# Patient Record
Sex: Female | Born: 1985 | Race: Black or African American | Hispanic: No | Marital: Married | Smoking: Never smoker
Health system: Southern US, Community
[De-identification: ages and names within clinical notes are randomized; demographics above are authoritative.]

## PROBLEM LIST (undated history)

## (undated) DIAGNOSIS — T7840XA Allergy, unspecified, initial encounter: Secondary | ICD-10-CM

## (undated) DIAGNOSIS — E079 Disorder of thyroid, unspecified: Secondary | ICD-10-CM

## (undated) HISTORY — DX: Disorder of thyroid, unspecified: E07.9

## (undated) HISTORY — DX: Allergy, unspecified, initial encounter: T78.40XA

---

## 2008-12-23 ENCOUNTER — Encounter (INDEPENDENT_AMBULATORY_CARE_PROVIDER_SITE_OTHER): Payer: Self-pay | Admitting: *Deleted

## 2009-01-11 ENCOUNTER — Ambulatory Visit: Payer: Self-pay | Admitting: Family Medicine

## 2009-01-11 LAB — CONVERTED CEMR LAB
Blood in Urine, dipstick: NEGATIVE
Ketones, urine, test strip: NEGATIVE
Nitrite: NEGATIVE
Specific Gravity, Urine: 1.015

## 2009-01-23 LAB — CONVERTED CEMR LAB
Alkaline Phosphatase: 67 units/L (ref 39–117)
BUN: 5 mg/dL — ABNORMAL LOW (ref 6–23)
Basophils Absolute: 0 10*3/uL (ref 0.0–0.1)
Bilirubin, Direct: 0.1 mg/dL (ref 0.0–0.3)
CO2: 29 meq/L (ref 19–32)
Calcium: 9.2 mg/dL (ref 8.4–10.5)
Creatinine, Ser: 0.6 mg/dL (ref 0.4–1.2)
Eosinophils Absolute: 0.1 10*3/uL (ref 0.0–0.7)
Glucose, Bld: 77 mg/dL (ref 70–99)
HDL: 55.5 mg/dL (ref >39.00)
Lymphocytes Relative: 39.9 % (ref 12.0–46.0)
MCHC: 33.7 g/dL (ref 30.0–36.0)
Neutrophils Relative %: 52.9 % (ref 43.0–77.0)
Platelets: 253 10*3/uL (ref 150.0–400.0)
RDW: 13.8 % (ref 11.5–14.6)
Total CHOL/HDL Ratio: 3
Triglycerides: 53 mg/dL (ref 0.0–149.0)

## 2009-01-25 ENCOUNTER — Ambulatory Visit: Payer: Self-pay | Admitting: Family Medicine

## 2009-01-25 DIAGNOSIS — H6120 Impacted cerumen, unspecified ear: Secondary | ICD-10-CM | POA: Insufficient documentation

## 2012-06-28 ENCOUNTER — Ambulatory Visit (INDEPENDENT_AMBULATORY_CARE_PROVIDER_SITE_OTHER): Payer: BC Managed Care – PPO | Admitting: Family Medicine

## 2012-06-28 ENCOUNTER — Encounter: Payer: Self-pay | Admitting: Family Medicine

## 2012-06-28 VITALS — BP 110/74 | HR 106 | Temp 98.4°F | Ht 66.5 in | Wt 200.6 lb

## 2012-06-28 DIAGNOSIS — Z202 Contact with and (suspected) exposure to infections with a predominantly sexual mode of transmission: Secondary | ICD-10-CM

## 2012-06-28 DIAGNOSIS — Z0000 Encounter for general adult medical examination without abnormal findings: Secondary | ICD-10-CM

## 2012-06-28 DIAGNOSIS — L299 Pruritus, unspecified: Secondary | ICD-10-CM

## 2012-06-28 DIAGNOSIS — R51 Headache: Secondary | ICD-10-CM

## 2012-06-28 DIAGNOSIS — N926 Irregular menstruation, unspecified: Secondary | ICD-10-CM

## 2012-06-28 LAB — BASIC METABOLIC PANEL
BUN: 11 mg/dL (ref 6–23)
Chloride: 104 mEq/L (ref 96–112)
Glucose, Bld: 83 mg/dL (ref 70–99)
Potassium: 3.5 mEq/L (ref 3.5–5.1)
Sodium: 137 mEq/L (ref 135–145)

## 2012-06-28 LAB — HEPATIC FUNCTION PANEL
ALT: 116 U/L — ABNORMAL HIGH (ref 0–35)
AST: 70 U/L — ABNORMAL HIGH (ref 0–37)
Albumin: 3.2 g/dL — ABNORMAL LOW (ref 3.5–5.2)
Alkaline Phosphatase: 77 U/L (ref 39–117)
Total Bilirubin: 0.3 mg/dL (ref 0.3–1.2)

## 2012-06-28 LAB — POCT URINE PREGNANCY: Preg Test, Ur: NEGATIVE

## 2012-06-28 LAB — LIPID PANEL
Cholesterol: 119 mg/dL (ref 0–200)
LDL Cholesterol: 71 mg/dL (ref 0–99)

## 2012-06-28 LAB — POCT URINALYSIS DIPSTICK
Ketones, UA: NEGATIVE
Protein, UA: NEGATIVE
Spec Grav, UA: >=1.03
Urobilinogen, UA: 0.2
pH, UA: 5

## 2012-06-28 LAB — MICROALBUMIN / CREATININE URINE RATIO
Creatinine,U: 107.6 mg/dL
Microalb Creat Ratio: 0.9 mg/g (ref 0.0–30.0)
Microalb, Ur: 1 mg/dL (ref 0.0–1.9)

## 2012-06-28 NOTE — Assessment & Plan Note (Signed)
aquaphor

## 2012-06-28 NOTE — Progress Notes (Signed)
Subjective:     Kao Camba is a 27 y.o. female and is here for a comprehensive physical exam. The patient reports problems-- irregular periods ,  Headaches  And itchy skin  History   Social History  . Marital Status: Married    Spouse Name: N/A    Number of Children: N/A  . Years of Education: N/A   Occupational History  . school--     Social History Main Topics  . Smoking status: Never Smoker   . Smokeless tobacco: Never Used  . Alcohol Use: No  . Drug Use: No  . Sexually Active: No   Other Topics Concern  . Not on file   Social History Narrative   Exercise-- no   No health maintenance topics applied.  The following portions of the patient's history were reviewed and updated as appropriate:  She  has no past medical history on file. She  does not have any pertinent problems on file. She  has no past surgical history on file. Her family history is not on file. She  reports that she has never smoked. She has never used smokeless tobacco. She reports that she does not drink alcohol or use illicit drugs. She has a current medication list which includes the following prescription(s): diphenhydramine and ferrous sulfate. No current outpatient prescriptions on file prior to visit.   No current facility-administered medications on file prior to visit.   She has No Known Allergies..  Review of Systems Review of Systems  Constitutional: Negative for activity change, appetite change and fatigue.  HENT: Negative for hearing loss, congestion, tinnitus and ear discharge.  dentist---due Eyes: Negative for visual disturbance (see optho q1y -- vision corrected to 20/20 with glasses).  Respiratory: Negative for cough, chest tightness and shortness of breath.   Cardiovascular: Negative for chest pain, palpitations and leg swelling.  Gastrointestinal: Negative for abdominal pain, diarrhea, constipation and abdominal distention.  Genitourinary: Negative for urgency, frequency,  decreased urine volume and difficulty urinating.  Musculoskeletal: Negative for back pain, arthralgias and gait problem.  Skin: Negative for color change, pallor and rash.  Neurological: Negative for dizziness, light-headedness, numbness and headaches.  Hematological: Negative for adenopathy. Does not bruise/bleed easily.  Psychiatric/Behavioral: Negative for suicidal ideas, confusion, sleep disturbance, self-injury, dysphoric mood, decreased concentration and agitation.       Objective:    BP 110/74  Pulse 106  Temp(Src) 98.4 F (36.9 C) (Oral)  Ht 5' 6.5" (1.689 m)  Wt 200 lb 9.6 oz (90.992 kg)  BMI 31.9 kg/m2  SpO2 97% General appearance: alert, cooperative, appears stated age and no distress Head: Normocephalic, without obvious abnormality, atraumatic Eyes: conjunctivae/corneas clear. PERRL, EOM's intact. Fundi benign. Ears: normal TM's and external ear canals both ears Nose: Nares normal. Septum midline. Mucosa normal. No drainage or sinus tenderness. Throat: lips, mucosa, and tongue normal; teeth and gums normal Neck: no adenopathy, supple, symmetrical, trachea midline and thyroid not enlarged, symmetric, no tenderness/mass/nodules Back: symmetric, no curvature. ROM normal. No CVA tenderness. Lungs: clear to auscultation bilaterally Breasts: deferred until later app Heart: regular rate and rhythm, S1, S2 normal, no murmur, click, rub or gallop Abdomen: soft, non-tender; bowel sounds normal; no masses,  no organomegaly Pelvic: deferred--to be scheduled Extremities: extremities normal, atraumatic, no cyanosis or edema Pulses: 2+ and symmetric Skin: Skin color, texture, turgor normal. No rashes or lesions Lymph nodes: Cervical, supraclavicular, and axillary nodes normal. Neurologic: Alert and oriented X 3, normal strength and tone. Normal symmetric reflexes. Normal coordination and  gait Psych-- no depression, no anxiety      Assessment:    Healthy female exam.       Plan:    ghm utd Check labs See After Visit Summary for Counseling Recommendations

## 2012-06-28 NOTE — Assessment & Plan Note (Signed)
US pelvis Pap Consider bcp

## 2012-06-28 NOTE — Patient Instructions (Addendum)
Preventive Care for Adults, Female A healthy lifestyle and preventive care can promote health and wellness. Preventive health guidelines for women include the following key practices.  A routine yearly physical is a good way to check with your caregiver about your health and preventive screening. It is a chance to share any concerns and updates on your health, and to receive a thorough exam.  Visit your dentist for a routine exam and preventive care every 6 months. Brush your teeth twice a day and floss once a day. Good oral hygiene prevents tooth decay and gum disease.  The frequency of eye exams is based on your age, health, family medical history, use of contact lenses, and other factors. Follow your caregiver's recommendations for frequency of eye exams.  Eat a healthy diet. Foods like vegetables, fruits, whole grains, low-fat dairy products, and lean protein foods contain the nutrients you need without too many calories. Decrease your intake of foods high in solid fats, added sugars, and salt. Eat the right amount of calories for you.Get information about a proper diet from your caregiver, if necessary.  Regular physical exercise is one of the most important things you can do for your health. Most adults should get at least 150 minutes of moderate-intensity exercise (any activity that increases your heart rate and causes you to sweat) each week. In addition, most adults need muscle-strengthening exercises on 2 or more days a week.  Maintain a healthy weight. The body mass index (BMI) is a screening tool to identify possible weight problems. It provides an estimate of body fat based on height and weight. Your caregiver can help determine your BMI, and can help you achieve or maintain a healthy weight.For adults 20 years and older:  A BMI below 18.5 is considered underweight.  A BMI of 18.5 to 24.9 is normal.  A BMI of 25 to 29.9 is considered overweight.  A BMI of 30 and above is  considered obese.  Maintain normal blood lipids and cholesterol levels by exercising and minimizing your intake of saturated fat. Eat a balanced diet with plenty of fruit and vegetables. Blood tests for lipids and cholesterol should begin at age 20 and be repeated every 5 years. If your lipid or cholesterol levels are high, you are over 50, or you are at high risk for heart disease, you may need your cholesterol levels checked more frequently.Ongoing high lipid and cholesterol levels should be treated with medicines if diet and exercise are not effective.  If you smoke, find out from your caregiver how to quit. If you do not use tobacco, do not start.  If you are pregnant, do not drink alcohol. If you are breastfeeding, be very cautious about drinking alcohol. If you are not pregnant and choose to drink alcohol, do not exceed 1 drink per day. One drink is considered to be 12 ounces (355 mL) of beer, 5 ounces (148 mL) of wine, or 1.5 ounces (44 mL) of liquor.  Avoid use of street drugs. Do not share needles with anyone. Ask for help if you need support or instructions about stopping the use of drugs.  High blood pressure causes heart disease and increases the risk of stroke. Your blood pressure should be checked at least every 1 to 2 years. Ongoing high blood pressure should be treated with medicines if weight loss and exercise are not effective.  If you are 55 to 25 years old, ask your caregiver if you should take aspirin to prevent strokes.  Diabetes   screening involves taking a blood sample to check your fasting blood sugar level. This should be done once every 3 years, after age 45, if you are within normal weight and without risk factors for diabetes. Testing should be considered at a younger age or be carried out more frequently if you are overweight and have at least 1 risk factor for diabetes.  Breast cancer screening is essential preventive care for women. You should practice "breast  self-awareness." This means understanding the normal appearance and feel of your breasts and may include breast self-examination. Any changes detected, no matter how small, should be reported to a caregiver. Women in their 20s and 30s should have a clinical breast exam (CBE) by a caregiver as part of a regular health exam every 1 to 3 years. After age 40, women should have a CBE every year. Starting at age 40, women should consider having a mammography (breast X-ray test) every year. Women who have a family history of breast cancer should talk to their caregiver about genetic screening. Women at a high risk of breast cancer should talk to their caregivers about having magnetic resonance imaging (MRI) and a mammography every year.  The Pap test is a screening test for cervical cancer. A Pap test can show cell changes on the cervix that might become cervical cancer if left untreated. A Pap test is a procedure in which cells are obtained and examined from the lower end of the uterus (cervix).  Women should have a Pap test starting at age 21.  Between ages 21 and 29, Pap tests should be repeated every 2 years.  Beginning at age 30, you should have a Pap test every 3 years as long as the past 3 Pap tests have been normal.  Some women have medical problems that increase the chance of getting cervical cancer. Talk to your caregiver about these problems. It is especially important to talk to your caregiver if a new problem develops soon after your last Pap test. In these cases, your caregiver may recommend more frequent screening and Pap tests.  The above recommendations are the same for women who have or have not gotten the vaccine for human papillomavirus (HPV).  If you had a hysterectomy for a problem that was not cancer or a condition that could lead to cancer, then you no longer need Pap tests. Even if you no longer need a Pap test, a regular exam is a good idea to make sure no other problems are  starting.  If you are between ages 65 and 70, and you have had normal Pap tests going back 10 years, you no longer need Pap tests. Even if you no longer need a Pap test, a regular exam is a good idea to make sure no other problems are starting.  If you have had past treatment for cervical cancer or a condition that could lead to cancer, you need Pap tests and screening for cancer for at least 20 years after your treatment.  If Pap tests have been discontinued, risk factors (such as a new sexual partner) need to be reassessed to determine if screening should be resumed.  The HPV test is an additional test that may be used for cervical cancer screening. The HPV test looks for the virus that can cause the cell changes on the cervix. The cells collected during the Pap test can be tested for HPV. The HPV test could be used to screen women aged 30 years and older, and should   be used in women of any age who have unclear Pap test results. After the age of 30, women should have HPV testing at the same frequency as a Pap test.  Colorectal cancer can be detected and often prevented. Most routine colorectal cancer screening begins at the age of 50 and continues through age 75. However, your caregiver may recommend screening at an earlier age if you have risk factors for colon cancer. On a yearly basis, your caregiver may provide home test kits to check for hidden blood in the stool. Use of a small camera at the end of a tube, to directly examine the colon (sigmoidoscopy or colonoscopy), can detect the earliest forms of colorectal cancer. Talk to your caregiver about this at age 50, when routine screening begins. Direct examination of the colon should be repeated every 5 to 10 years through age 75, unless early forms of pre-cancerous polyps or small growths are found.  Hepatitis C blood testing is recommended for all people born from 1945 through 1965 and any individual with known risks for hepatitis C.  Practice  safe sex. Use condoms and avoid high-risk sexual practices to reduce the spread of sexually transmitted infections (STIs). STIs include gonorrhea, chlamydia, syphilis, trichomonas, herpes, HPV, and human immunodeficiency virus (HIV). Herpes, HIV, and HPV are viral illnesses that have no cure. They can result in disability, cancer, and death. Sexually active women aged 25 and younger should be checked for chlamydia. Older women with new or multiple partners should also be tested for chlamydia. Testing for other STIs is recommended if you are sexually active and at increased risk.  Osteoporosis is a disease in which the bones lose minerals and strength with aging. This can result in serious bone fractures. The risk of osteoporosis can be identified using a bone density scan. Women ages 65 and over and women at risk for fractures or osteoporosis should discuss screening with their caregivers. Ask your caregiver whether you should take a calcium supplement or vitamin D to reduce the rate of osteoporosis.  Menopause can be associated with physical symptoms and risks. Hormone replacement therapy is available to decrease symptoms and risks. You should talk to your caregiver about whether hormone replacement therapy is right for you.  Use sunscreen with sun protection factor (SPF) of 30 or more. Apply sunscreen liberally and repeatedly throughout the day. You should seek shade when your shadow is shorter than you. Protect yourself by wearing long sleeves, pants, a wide-brimmed hat, and sunglasses year round, whenever you are outdoors.  Once a month, do a whole body skin exam, using a mirror to look at the skin on your back. Notify your caregiver of new moles, moles that have irregular borders, moles that are larger than a pencil eraser, or moles that have changed in shape or color.  Stay current with required immunizations.  Influenza. You need a dose every fall (or winter). The composition of the flu vaccine  changes each year, so being vaccinated once is not enough.  Pneumococcal polysaccharide. You need 1 to 2 doses if you smoke cigarettes or if you have certain chronic medical conditions. You need 1 dose at age 65 (or older) if you have never been vaccinated.  Tetanus, diphtheria, pertussis (Tdap, Td). Get 1 dose of Tdap vaccine if you are younger than age 65, are over 65 and have contact with an infant, are a healthcare worker, are pregnant, or simply want to be protected from whooping cough. After that, you need a Td   booster dose every 10 years. Consult your caregiver if you have not had at least 3 tetanus and diphtheria-containing shots sometime in your life or have a deep or dirty wound.  HPV. You need this vaccine if you are a woman age 26 or younger. The vaccine is given in 3 doses over 6 months.  Measles, mumps, rubella (MMR). You need at least 1 dose of MMR if you were born in 1957 or later. You may also need a second dose.  Meningococcal. If you are age 19 to 21 and a first-year college student living in a residence hall, or have one of several medical conditions, you need to get vaccinated against meningococcal disease. You may also need additional booster doses.  Zoster (shingles). If you are age 60 or older, you should get this vaccine.  Varicella (chickenpox). If you have never had chickenpox or you were vaccinated but received only 1 dose, talk to your caregiver to find out if you need this vaccine.  Hepatitis A. You need this vaccine if you have a specific risk factor for hepatitis A virus infection or you simply wish to be protected from this disease. The vaccine is usually given as 2 doses, 6 to 18 months apart.  Hepatitis B. You need this vaccine if you have a specific risk factor for hepatitis B virus infection or you simply wish to be protected from this disease. The vaccine is given in 3 doses, usually over 6 months. Preventive Services / Frequency Ages 19 to 39  Blood  pressure check.** / Every 1 to 2 years.  Lipid and cholesterol check.** / Every 5 years beginning at age 20.  Clinical breast exam.** / Every 3 years for women in their 20s and 30s.  Pap test.** / Every 2 years from ages 21 through 29. Every 3 years starting at age 30 through age 65 or 70 with a history of 3 consecutive normal Pap tests.  HPV screening.** / Every 3 years from ages 30 through ages 65 to 70 with a history of 3 consecutive normal Pap tests.  Hepatitis C blood test.** / For any individual with known risks for hepatitis C.  Skin self-exam. / Monthly.  Influenza immunization.** / Every year.  Pneumococcal polysaccharide immunization.** / 1 to 2 doses if you smoke cigarettes or if you have certain chronic medical conditions.  Tetanus, diphtheria, pertussis (Tdap, Td) immunization. / A one-time dose of Tdap vaccine. After that, you need a Td booster dose every 10 years.  HPV immunization. / 3 doses over 6 months, if you are 26 and younger.  Measles, mumps, rubella (MMR) immunization. / You need at least 1 dose of MMR if you were born in 1957 or later. You may also need a second dose.  Meningococcal immunization. / 1 dose if you are age 19 to 21 and a first-year college student living in a residence hall, or have one of several medical conditions, you need to get vaccinated against meningococcal disease. You may also need additional booster doses.  Varicella immunization.** / Consult your caregiver.  Hepatitis A immunization.** / Consult your caregiver. 2 doses, 6 to 18 months apart.  Hepatitis B immunization.** / Consult your caregiver. 3 doses usually over 6 months. Ages 40 to 64  Blood pressure check.** / Every 1 to 2 years.  Lipid and cholesterol check.** / Every 5 years beginning at age 20.  Clinical breast exam.** / Every year after age 40.  Mammogram.** / Every year beginning at age 40   and continuing for as long as you are in good health. Consult with your  caregiver.  Pap test.** / Every 3 years starting at age 30 through age 65 or 70 with a history of 3 consecutive normal Pap tests.  HPV screening.** / Every 3 years from ages 30 through ages 65 to 70 with a history of 3 consecutive normal Pap tests.  Fecal occult blood test (FOBT) of stool. / Every year beginning at age 50 and continuing until age 75. You may not need to do this test if you get a colonoscopy every 10 years.  Flexible sigmoidoscopy or colonoscopy.** / Every 5 years for a flexible sigmoidoscopy or every 10 years for a colonoscopy beginning at age 50 and continuing until age 75.  Hepatitis C blood test.** / For all people born from 1945 through 1965 and any individual with known risks for hepatitis C.  Skin self-exam. / Monthly.  Influenza immunization.** / Every year.  Pneumococcal polysaccharide immunization.** / 1 to 2 doses if you smoke cigarettes or if you have certain chronic medical conditions.  Tetanus, diphtheria, pertussis (Tdap, Td) immunization.** / A one-time dose of Tdap vaccine. After that, you need a Td booster dose every 10 years.  Measles, mumps, rubella (MMR) immunization. / You need at least 1 dose of MMR if you were born in 1957 or later. You may also need a second dose.  Varicella immunization.** / Consult your caregiver.  Meningococcal immunization.** / Consult your caregiver.  Hepatitis A immunization.** / Consult your caregiver. 2 doses, 6 to 18 months apart.  Hepatitis B immunization.** / Consult your caregiver. 3 doses, usually over 6 months. Ages 65 and over  Blood pressure check.** / Every 1 to 2 years.  Lipid and cholesterol check.** / Every 5 years beginning at age 20.  Clinical breast exam.** / Every year after age 40.  Mammogram.** / Every year beginning at age 40 and continuing for as long as you are in good health. Consult with your caregiver.  Pap test.** / Every 3 years starting at age 30 through age 65 or 70 with a 3  consecutive normal Pap tests. Testing can be stopped between 65 and 70 with 3 consecutive normal Pap tests and no abnormal Pap or HPV tests in the past 10 years.  HPV screening.** / Every 3 years from ages 30 through ages 65 or 70 with a history of 3 consecutive normal Pap tests. Testing can be stopped between 65 and 70 with 3 consecutive normal Pap tests and no abnormal Pap or HPV tests in the past 10 years.  Fecal occult blood test (FOBT) of stool. / Every year beginning at age 50 and continuing until age 75. You may not need to do this test if you get a colonoscopy every 10 years.  Flexible sigmoidoscopy or colonoscopy.** / Every 5 years for a flexible sigmoidoscopy or every 10 years for a colonoscopy beginning at age 50 and continuing until age 75.  Hepatitis C blood test.** / For all people born from 1945 through 1965 and any individual with known risks for hepatitis C.  Osteoporosis screening.** / A one-time screening for women ages 65 and over and women at risk for fractures or osteoporosis.  Skin self-exam. / Monthly.  Influenza immunization.** / Every year.  Pneumococcal polysaccharide immunization.** / 1 dose at age 65 (or older) if you have never been vaccinated.  Tetanus, diphtheria, pertussis (Tdap, Td) immunization. / A one-time dose of Tdap vaccine if you are over   65 and have contact with an infant, are a healthcare worker, or simply want to be protected from whooping cough. After that, you need a Td booster dose every 10 years.  Varicella immunization.** / Consult your caregiver.  Meningococcal immunization.** / Consult your caregiver.  Hepatitis A immunization.** / Consult your caregiver. 2 doses, 6 to 18 months apart.  Hepatitis B immunization.** / Check with your caregiver. 3 doses, usually over 6 months. ** Family history and personal history of risk and conditions may change your caregiver's recommendations. Document Released: 04/18/2001 Document Revised: 05/15/2011  Document Reviewed: 07/18/2010 [employer] Patient Information 2013 [employer].  

## 2012-07-02 ENCOUNTER — Other Ambulatory Visit (HOSPITAL_BASED_OUTPATIENT_CLINIC_OR_DEPARTMENT_OTHER): Payer: BC Managed Care – PPO

## 2012-07-04 ENCOUNTER — Other Ambulatory Visit: Payer: Self-pay | Admitting: Family Medicine

## 2012-07-04 DIAGNOSIS — E0590 Thyrotoxicosis, unspecified without thyrotoxic crisis or storm: Secondary | ICD-10-CM

## 2012-07-09 ENCOUNTER — Ambulatory Visit (HOSPITAL_BASED_OUTPATIENT_CLINIC_OR_DEPARTMENT_OTHER)
Admission: RE | Admit: 2012-07-09 | Discharge: 2012-07-09 | Disposition: A | Discharge status: Home / Self Care | Payer: BC Managed Care – PPO | Source: Ambulatory Visit | Attending: Family Medicine | Admitting: Family Medicine

## 2012-07-09 DIAGNOSIS — N926 Irregular menstruation, unspecified: Secondary | ICD-10-CM

## 2012-07-09 DIAGNOSIS — N915 Oligomenorrhea, unspecified: Secondary | ICD-10-CM | POA: Insufficient documentation

## 2012-07-15 ENCOUNTER — Other Ambulatory Visit: Payer: BC Managed Care – PPO

## 2012-07-16 ENCOUNTER — Encounter: Payer: Self-pay | Admitting: Family Medicine

## 2012-07-16 ENCOUNTER — Ambulatory Visit (INDEPENDENT_AMBULATORY_CARE_PROVIDER_SITE_OTHER): Payer: BC Managed Care – PPO | Admitting: Family Medicine

## 2012-07-16 ENCOUNTER — Other Ambulatory Visit: Payer: BC Managed Care – PPO

## 2012-07-16 VITALS — BP 118/74 | HR 96 | Temp 98.4°F | Wt 202.4 lb

## 2012-07-16 DIAGNOSIS — R7989 Other specified abnormal findings of blood chemistry: Secondary | ICD-10-CM

## 2012-07-16 DIAGNOSIS — E0590 Thyrotoxicosis, unspecified without thyrotoxic crisis or storm: Secondary | ICD-10-CM

## 2012-07-16 DIAGNOSIS — N926 Irregular menstruation, unspecified: Secondary | ICD-10-CM

## 2012-07-16 LAB — HEPATIC FUNCTION PANEL
ALT: 62 U/L — ABNORMAL HIGH (ref 0–35)
AST: 61 U/L — ABNORMAL HIGH (ref 0–37)
Total Bilirubin: 0.5 mg/dL (ref 0.3–1.2)

## 2012-07-16 NOTE — Addendum Note (Signed)
Addended by: Silvio Pate D on: 07/16/2012 11:13 AM   Modules accepted: Orders

## 2012-07-16 NOTE — Assessment & Plan Note (Signed)
Reviewed [place] with pt Referral to gyn Pt refused bcp at this time

## 2012-07-16 NOTE — Addendum Note (Signed)
Addended by: Silvio Pate D on: 07/16/2012 09:51 AM   Modules accepted: Orders

## 2012-07-16 NOTE — Progress Notes (Signed)
  Subjective:    Patient ID: Natasha Scott, female    DOB: 08-28-1985, 27 y.o.   MRN: [id number]  HPI Pt here to discuss her [place] results.  Pt is willing to see gyn but does not really want to go on BCP. Pt period did start again but has since stopped.  No new complaints.   Review of Systems As above    Objective:   Physical Exam BP 118/74  Pulse 96  Temp(Src) 98.4 F (36.9 C) (Oral)  Wt 202 lb 6.4 oz (91.808 kg)  BMI 32.18 kg/m2  SpO2 97%  LMP 07/13/2012 General appearance: alert, cooperative, appears stated age and no distress Abdomen: soft, non-tender; bowel sounds normal; no masses,  no organomegaly Neurologic: Grossly normal        Assessment & Plan:

## 2012-07-17 LAB — T4, FREE: Free T4: 5.13 ng/dL — ABNORMAL HIGH (ref 0.60–1.60)

## 2012-07-22 ENCOUNTER — Other Ambulatory Visit: Payer: Self-pay | Admitting: *Deleted

## 2012-07-22 DIAGNOSIS — E0590 Thyrotoxicosis, unspecified without thyrotoxic crisis or storm: Secondary | ICD-10-CM

## 2012-08-05 ENCOUNTER — Ambulatory Visit (INDEPENDENT_AMBULATORY_CARE_PROVIDER_SITE_OTHER): Payer: BC Managed Care – PPO | Admitting: Internal Medicine

## 2012-08-05 ENCOUNTER — Encounter: Payer: Self-pay | Admitting: Internal Medicine

## 2012-08-05 VITALS — BP 122/70 | HR 114 | Temp 97.7°F | Resp 12 | Ht 67.5 in | Wt 187.0 lb

## 2012-08-05 DIAGNOSIS — E0590 Thyrotoxicosis, unspecified without thyrotoxic crisis or storm: Secondary | ICD-10-CM

## 2012-08-05 DIAGNOSIS — E0500 Thyrotoxicosis with diffuse goiter without thyrotoxic crisis or storm: Secondary | ICD-10-CM | POA: Insufficient documentation

## 2012-08-05 MED ORDER — ATENOLOL 50 MG PO TABS: 50.0000 mg | ORAL_TABLET | Freq: Every day | ORAL | Status: DC

## 2012-08-05 NOTE — Progress Notes (Signed)
Patient ID: Natasha Scott, female   DOB: 06-Oct-1985, 27 y.o.   MRN: [id number]   HPI  Natasha Scott is a 27 y.o.-year-old female, referred by her PCP, Dr.Lowne, for evaluation for thyrotoxicosis.   She does not have a h/o thyroid disorders, but has been found to have a low TSH 2 mo ago.   Lab Results  Component Value Date   TSH 0.11* 07/16/2012   TSH 0.10* 06/28/2012   TSH 0.43 01/11/2009   FREET4 5.13* 07/16/2012  FREE T3 17.3*   Pt denies: heat intolerance, tremors, anxiety, palpitations, insomnia, hyperdefecation, but she had recent weight loss of 33 lbs in last few months. She also has dysphagia - needs to chew her food well to swallow it - in last 2 months,  odynophagia, lumps in neck or SOB with lying down.   She denies recent contrast studies, using iodine disinfectants, increasing iodine in diet (kelp, seaweed) or taking OTC herbal or weight loss supplements. she is not on Amiodarone or other meds that can decrease TSH. No use of steroids.  Pt does not have a FH of thyroid ds or autoimmune ds.   I reviewed her chart and she also has a history of irregular menstrual cycles - she has had this problem ever since Menarche but now worse. She was started on Provera, but does not take it consistently.  ROS: Constitutional: + weight loss, no fatigue, no subjective hyperthermia/hypothermia Eyes: no blurry vision, no xerophthalmia ENT: no sore throat, no nodules palpated in throat, + dysphagia/no odynophagia, no hoarseness Cardiovascular: no CP/SOB/palpitations/leg swelling Respiratory: no cough/SOB Gastrointestinal: +N/no V/D/C Musculoskeletal: no muscle/joint aches Skin: no rashes; + itching Neurological: no tremors/numbness/tingling/dizziness Psychiatric: no depression/anxiety Irreg menstrual cycles  Past Medical History  Diagnosis Date  . Thyroid disease     hyperthyroidism   . Allergy     seasonal allergies, itching   History reviewed. No pertinent past surgical  history.  History   Social History  . Marital Status: single    Number of Children: 0   Occupational History  . school-- MBA    Social History Main Topics  . Smoking status: Never Smoker   . Smokeless tobacco: Never Used  . Alcohol Use: No  . Drug Use: No  . Sexually Active: No   Social History Narrative   Exercise-- no   Current Outpatient Prescriptions on File Prior to Visit  Medication Sig Dispense Refill  . cetirizine (ZYRTEC) 10 MG tablet Take 10 mg by mouth daily.      . ferrous sulfate 325 (65 FE) MG tablet Take 325 mg by mouth daily with breakfast.       No current facility-administered medications on file prior to visit.   No Known Allergies  No family history on file.  PE: BP 122/70  Pulse 114  Temp(Src) 97.7 F (36.5 C) (Oral)  Resp 12  Ht 5' 7.5" (1.715 m)  Wt 187 lb (84.823 kg)  BMI 28.84 kg/m2  SpO2 97%  LMP 04/15/2012 Wt Readings from Last 3 Encounters:  08/05/12 187 lb (84.823 kg)  07/16/12 202 lb 6.4 oz (91.808 kg)  06/28/12 200 lb 9.6 oz (90.992 kg)   Constitutional: overweight, in NAD Eyes: PERRLA, EOMI, no exophthalmos, no lid lag, no stare ENT: moist mucous membranes, + thyromegaly R>L, no individual nodules palpated, no thyroid bruit, no cervical lymphadenopathy Cardiovascular: tachycardia, regular rhythm, No MRG Respiratory: CTA B Gastrointestinal: abdomen soft, NT, ND, BS+ Musculoskeletal: no deformities, strength intact in all 4 Skin: moist,  warm, no rashes Neurological: slight tremor with outstretched hands, DTR normal in all 4  ASSESSMENT: 1. Thyrotoxicosis  PLAN:  1. Pt with recently discovered low TSH x2; she also has a free T4 >> confirming thyrotoxicosis. She appears euthyroid, but does report weight loss and has a rapid heart rate and a slight tremor on physical exam. I do not hear a thyroid bruit and her reflexes are not exaggerated. - we discussed about the fact that her thyrotoxicosis can be caused by: Graves' disease   Toxic uni- or multinodular goiter  Thyroiditis - To differentiate between these, we will need to a thyroid uptake and scan.  - we discussed about possible treatment for all of the above, and she appears reticent to radioactive iodine treatment, but also for taking pills for the rest of her life - for now, we'll await the results for the uptake and scan, and I will contact her with the results and to discuss further plan depending on the etiology of her thyrotoxicosis. - I sent atenolol 50 mg to her pharmacy to help with her rapid pulse and her tremors - strongly advised her to use protection and to not get pregnant while her thyroid tests are so abnormal - I will see her back in 2 months to repeat her labs  *RADIOLOGY REPORT*  Clinical Data: Hyperthyroidism. TSH equals 0.1   THYROID SCAN AND UPTAKE - 24 HOURS  Technique: Following the per oral administration of I-131 sodium  iodide, the patient returned at 24 hours and uptake measurements  were acquired with the uptake probe centered on the neck. Thyroid  imaging was performed following the intravenous administration of  the Tc-55m Pertechnetate.  Radiopharmaceuticals: 7.7 uCi I-131 Sodium Iodide and 10.4 mCi TC-  62m Pertechnetate  Comparison: None.  Findings: There is uniform increased uptake within enlarged thyroid  gland. The salivary glands are barely visible.  24 hour I 131 uptake = 77.1 % (normal 10-30%)   IMPRESSION:  Thyroid imaging and 24 uptake consistent with Graves' disease.  Original Report Authenticated By: Genevive Bi, M.D.   New dx of Graves ds. D/w pt to start MMI 10 mg bid. Will need to decide if wants RAI ablation or continue MMI. Explained advantages and disadvantages of both. She will let me know. Will RTC in 3 weeks for labs and in 1.5 mo for a visit. Continue beta blocker for now.

## 2012-08-05 NOTE — Patient Instructions (Addendum)
Please let me know if you are not called for the thyroid uptake and scan in 5 days.  Please take Atenolol once a day to help with tremors and increased heart rate.

## 2012-08-08 ENCOUNTER — Telehealth: Payer: Self-pay

## 2012-08-08 NOTE — Telephone Encounter (Signed)
Can you please tell her to call Nuclear Medicine and get them rescheduled? I see they are scheduled at [facility]. The tel nr I believe is [phone]. Thank you.

## 2012-08-08 NOTE — Telephone Encounter (Signed)
Yes, it is ok

## 2012-08-08 NOTE — Telephone Encounter (Signed)
Called and left message on patient voicemail that its ok for her to take atenolol and zyrtec together and the number so that she can reschedule her appointment.

## 2012-08-08 NOTE — Telephone Encounter (Signed)
Patient called and wants to change her appointments on 6/19 and 6/20 because she will be out of town.

## 2012-08-08 NOTE — Telephone Encounter (Signed)
Can she take the atenolol and zyrtec together

## 2012-08-08 NOTE — Telephone Encounter (Signed)
Patient called and wants to know is it okay for her to take Atenolol and Zyrtec together. She wants to take the Zyrtec cause she has been itching.

## 2012-08-22 ENCOUNTER — Ambulatory Visit (HOSPITAL_COMMUNITY): Payer: BC Managed Care – PPO

## 2012-08-23 ENCOUNTER — Other Ambulatory Visit (HOSPITAL_COMMUNITY): Payer: BC Managed Care – PPO

## 2012-08-26 ENCOUNTER — Encounter (HOSPITAL_COMMUNITY)
Admission: RE | Admit: 2012-08-26 | Discharge: 2012-08-26 | Disposition: A | Discharge status: Home / Self Care | Payer: BC Managed Care – PPO | Source: Ambulatory Visit | Attending: Internal Medicine | Admitting: Internal Medicine

## 2012-08-26 DIAGNOSIS — E0500 Thyrotoxicosis with diffuse goiter without thyrotoxic crisis or storm: Secondary | ICD-10-CM | POA: Insufficient documentation

## 2012-08-27 ENCOUNTER — Encounter (HOSPITAL_COMMUNITY)
Admission: RE | Admit: 2012-08-27 | Discharge: 2012-08-27 | Disposition: A | Discharge status: Home / Self Care | Payer: BC Managed Care – PPO | Source: Ambulatory Visit | Attending: Internal Medicine | Admitting: Internal Medicine

## 2012-08-27 MED ORDER — SODIUM PERTECHNETATE TC 99M INJECTION
10.4000 | Freq: Once | INTRAVENOUS | Status: AC | PRN
  Administered 2012-08-27: 10 via INTRAVENOUS

## 2012-08-27 MED ORDER — SODIUM IODIDE I 131 CAPSULE
7.7000 | Freq: Once | INTRAVENOUS | Status: AC | PRN
  Administered 2012-08-27: 7.7 via ORAL

## 2012-08-29 ENCOUNTER — Other Ambulatory Visit: Payer: Self-pay | Admitting: Internal Medicine

## 2012-08-29 ENCOUNTER — Telehealth: Payer: Self-pay | Admitting: *Deleted

## 2012-08-29 DIAGNOSIS — E0500 Thyrotoxicosis with diffuse goiter without thyrotoxic crisis or storm: Secondary | ICD-10-CM

## 2012-08-29 MED ORDER — METHIMAZOLE 10 MG PO TABS: 10.0000 mg | ORAL_TABLET | Freq: Two times a day (BID) | ORAL | Status: DC

## 2012-08-29 NOTE — Telephone Encounter (Signed)
Pt called requesting test results. Please advise

## 2012-08-29 NOTE — Telephone Encounter (Signed)
Called pt with results and plan.  

## 2012-09-04 ENCOUNTER — Encounter: Payer: Self-pay | Admitting: Internal Medicine

## 2012-09-04 NOTE — Telephone Encounter (Signed)
Please read pt's message

## 2012-09-30 ENCOUNTER — Encounter: Payer: Self-pay | Admitting: Family Medicine

## 2012-09-30 ENCOUNTER — Encounter: Payer: Self-pay | Admitting: Internal Medicine

## 2012-10-07 ENCOUNTER — Ambulatory Visit: Payer: BC Managed Care – PPO | Admitting: Internal Medicine

## 2012-10-10 ENCOUNTER — Ambulatory Visit: Payer: BC Managed Care – PPO | Admitting: Internal Medicine

## 2012-10-18 ENCOUNTER — Ambulatory Visit: Payer: BC Managed Care – PPO | Admitting: Internal Medicine

## 2012-10-18 ENCOUNTER — Encounter: Payer: Self-pay | Admitting: Internal Medicine

## 2012-11-15 ENCOUNTER — Encounter: Payer: Self-pay | Admitting: Internal Medicine

## 2012-11-15 ENCOUNTER — Ambulatory Visit (INDEPENDENT_AMBULATORY_CARE_PROVIDER_SITE_OTHER): Payer: BC Managed Care – PPO | Admitting: Internal Medicine

## 2012-11-15 VITALS — BP 126/70 | HR 89 | Temp 98.1°F | Resp 12 | Wt 200.0 lb

## 2012-11-15 DIAGNOSIS — E0500 Thyrotoxicosis with diffuse goiter without thyrotoxic crisis or storm: Secondary | ICD-10-CM

## 2012-11-15 LAB — TSH: TSH: 0.17 u[IU]/mL — ABNORMAL LOW (ref 0.35–5.50)

## 2012-11-15 LAB — T4, FREE: Free T4: 3.36 ng/dL — ABNORMAL HIGH (ref 0.60–1.60)

## 2012-11-15 NOTE — Progress Notes (Signed)
Patient ID: Natasha Scott, female   DOB: 1985/10/31, 27 y.o.   MRN: [id number]   HPI  Natasha Scott is a 27 y.o.-year-old female, returning for management of Graves' disease.   I saw the patient in 08/2012 for evaluation for thyrotoxicosis. We checked a thyroid uptake and scan on 08/27/2012, and it showed an uptake of 77%, with a homogeneous thyroid scan, consistent with Graves' disease. At that time, I discussed with the patient about the possibility of treating her with radioactive iodine or starting on methimazole. She decided to go with the methimazole, which we started at 10 mg bid. Despite the advice to return in 4-6 weeks for repeat labs, she continued on methimazole the same dose up until this visit. Today she tells me that she missed 6 days of methimazole last week. She is also on atenolol, and she missed few doses of this last week, too.   I reviewed her latest TSH levels: Lab Results  Component Value Date   TSH 0.11* 07/16/2012   TSH 0.10* 06/28/2012   TSH 0.43 01/11/2009   FREET4 5.13* 07/16/2012  FREE T3 17.3*    Pt c/o: - + weight gain+13 lbs in last 3 mo - + increased sleep time - no heat intolerance - no tremors - no anxiety - no palpitations - no hyperdefecation/constipation - no dry skin - no hair loss  She denies dysphagia, odynophagia, lumps in neck or SOB with lying down.   I reviewed pt's medications, allergies, PMH, social hx, family hx and no changes required, except as mentioned above.  ROS: Constitutional: + weight gain, no fatigue, no subjective hyperthermia/hypothermia Eyes: no blurry vision, no xerophthalmia ENT: no sore throat, no nodules palpated in throat, nodysphagia/no odynophagia, no hoarseness Cardiovascular: no CP/SOB/palpitations/leg swelling Respiratory: no cough/SOB Gastrointestinal: no N/V/D/C Musculoskeletal: no muscle/joint aches Skin: no rashes Neurological: no tremors/numbness/tingling/dizziness  PE: BP 126/70  Pulse 89   Temp(Src) 98.1 F (36.7 C) (Oral)  Resp 12  Wt 200 lb (90.719 kg)  BMI 30.84 kg/m2  SpO2 98% Wt Readings from Last 3 Encounters:  11/15/12 200 lb (90.719 kg)  08/05/12 187 lb (84.823 kg)  07/16/12 202 lb 6.4 oz (91.808 kg)   Constitutional: overweight, in NAD Eyes: PERRLA, EOMI, no exophthalmos, no lid lag, no stare ENT: moist mucous membranes, + large thyroid, thyromegaly R>L, no individual nodules palpated, no cervical lymphadenopathy Cardiovascular: RRR, No MRG Respiratory: CTA B Gastrointestinal: abdomen soft, NT, ND, BS+ Musculoskeletal: no deformities, strength intact in all 4 Skin: moist, warm, no rashes Neurological: no tremor with outstretched hands, DTR normal in all 4  ASSESSMENT: 1. Graves ds.  PLAN:  1. Pt with recently diagnosed Graves' disease, on a rather large dose of methimazole that was started 3 months ago. Patient did not return for her scheduled labs in 4-6 weeks after starting methimazole. She now complains of weight gain and some hypersomnia, and she does not have tachycardia, tremors, palpitations anymore. - I underlined the importance of coming to have labs checked especially while on methimazole - Will check TSH, free T4, free T3 today, I will decide how to change the methimazole dose based on the results - I did order labs to be rechecked in 4-6 weeks - I advised her to stop atenolol  - I will see her back in 3 months  Office Visit on 11/15/2012  Component Date Value Range Status  . TSH 11/15/2012 0.17* 0.35 - 5.50 uIU/mL Final  . Free T4 11/15/2012 3.36* 0.60 - 1.60 ng/dL  Final  . T3, Free 11/15/2012 10.6* 2.3 - 4.2 pg/mL Final   Msg sent: Dear Ms Natasha Scott, The tests are still indicative of hyperthyroidism. The only way I can explain this is by you not being on the medication for a period of time. Please restart taking the Methimazole 2x a day, as before, Try not to miss doses. If your heart rate is high >90 and you have palpitations again, please  take the Atenolol, too as you did before. Please return as advised for repeat labs. Please let me know if you have any questions. Sincerely, Carlus Pavlov MD

## 2012-11-15 NOTE — Patient Instructions (Addendum)
Please stop Atenolol. Continue Methimazole 10 mg daily.  Return to se me in 3 months. Schedule a lab appointment in 4-5 weeks from now.

## 2012-11-23 ENCOUNTER — Other Ambulatory Visit: Payer: Self-pay | Admitting: Internal Medicine

## 2012-11-29 ENCOUNTER — Ambulatory Visit: Payer: BC Managed Care – PPO | Admitting: Internal Medicine

## 2012-12-23 ENCOUNTER — Other Ambulatory Visit: Payer: Self-pay | Admitting: *Deleted

## 2012-12-23 ENCOUNTER — Other Ambulatory Visit: Payer: Self-pay | Admitting: Internal Medicine

## 2012-12-23 MED ORDER — METHIMAZOLE 10 MG PO TABS: 10.0000 mg | ORAL_TABLET | Freq: Two times a day (BID) | ORAL | Status: DC

## 2012-12-23 NOTE — Progress Notes (Signed)
rx sent, pt notified  

## 2012-12-24 ENCOUNTER — Telehealth: Payer: Self-pay | Admitting: Internal Medicine

## 2012-12-24 ENCOUNTER — Other Ambulatory Visit: Payer: Self-pay | Admitting: *Deleted

## 2012-12-24 MED ORDER — METHIMAZOLE 10 MG PO TABS: 10.0000 mg | ORAL_TABLET | Freq: Two times a day (BID) | ORAL | Status: DC

## 2012-12-25 ENCOUNTER — Other Ambulatory Visit: Payer: Self-pay | Admitting: *Deleted

## 2012-12-25 MED ORDER — METHIMAZOLE 10 MG PO TABS: 10.0000 mg | ORAL_TABLET | Freq: Two times a day (BID) | ORAL | Status: DC

## 2012-12-25 NOTE — Telephone Encounter (Signed)
Pt needs to change her rx to [facility] per insurance. Re-sending refill in to them. (fax [phone]).

## 2013-01-09 ENCOUNTER — Other Ambulatory Visit: Payer: Self-pay

## 2013-01-29 ENCOUNTER — Other Ambulatory Visit (INDEPENDENT_AMBULATORY_CARE_PROVIDER_SITE_OTHER): Payer: BC Managed Care – PPO

## 2013-01-29 ENCOUNTER — Other Ambulatory Visit: Payer: Self-pay | Admitting: Internal Medicine

## 2013-01-29 DIAGNOSIS — E0500 Thyrotoxicosis with diffuse goiter without thyrotoxic crisis or storm: Secondary | ICD-10-CM

## 2013-01-29 LAB — T4, FREE: Free T4: 0.46 ng/dL — ABNORMAL LOW (ref 0.60–1.60)

## 2013-01-29 LAB — TSH: TSH: 4.03 u[IU]/mL (ref 0.35–5.50)

## 2013-01-29 LAB — T3, FREE: T3, Free: 2.1 pg/mL — ABNORMAL LOW (ref 2.3–4.2)

## 2013-02-18 ENCOUNTER — Ambulatory Visit: Payer: BC Managed Care – PPO | Admitting: Internal Medicine

## 2013-03-13 ENCOUNTER — Other Ambulatory Visit: Payer: Self-pay | Admitting: Internal Medicine

## 2013-03-13 ENCOUNTER — Encounter: Payer: Self-pay | Admitting: Internal Medicine

## 2013-03-13 ENCOUNTER — Ambulatory Visit (INDEPENDENT_AMBULATORY_CARE_PROVIDER_SITE_OTHER): Payer: BC Managed Care – PPO | Admitting: Internal Medicine

## 2013-03-13 VITALS — BP 118/68 | HR 76 | Temp 97.7°F | Resp 12 | Wt 217.7 lb

## 2013-03-13 DIAGNOSIS — E0500 Thyrotoxicosis with diffuse goiter without thyrotoxic crisis or storm: Secondary | ICD-10-CM

## 2013-03-13 NOTE — Progress Notes (Signed)
Patient ID: Natasha Scott, female   DOB: 04/04/1985, 28 y.o.   MRN: [id number]   HPI  Natasha Scott is a 28 y.o.-year-old female, returning for management of Graves' disease.  Last visit 3 mo ago.  Reviewed hx: I saw the patient in 08/2012 for evaluation for thyrotoxicosis. We checked a thyroid uptake and scan on 08/27/2012, and it showed an uptake of 77%, with a homogeneous thyroid scan, consistent with Graves' disease. At that time, I discussed with the patient about the possibility of treating her with radioactive iodine or starting on methimazole. She decided to go with the methimazole, which we started at 10 mg bid.   We decreased the MMI from 5 mg bid to 5 mg daily after last set of labs. We stopped Atenolol at last visit.  I reviewed her latest TSH levels: Lab Results  Component Value Date   TSH 4.03 01/29/2013   TSH 0.17* 11/15/2012   TSH 0.11* 07/16/2012   TSH 0.10* 06/28/2012   TSH 0.43 01/11/2009   FREET4 0.46* 01/29/2013   FREET4 3.36* 11/15/2012   FREET4 5.13* 07/16/2012    Pt c/o: - + weight gain +17 lbs in last 3 mo - + sleeping more - no heat intolerance - no tremors - no anxiety - no palpitations - no hyperdefecation/constipation - no dry skin - no hair loss  She did not have a menstrual cycle in >2 mo. She was on Provera before but no cycles. She did not see an ObGyn dr yet.   She denies dysphagia, odynophagia, lumps in neck or SOB with lying down.   I reviewed pt's medications, allergies, PMH, social hx, family hx and no changes required, except as mentioned above.  ROS: Constitutional: + weight gain, no fatigue, no subjective hyperthermia/hypothermia Eyes: no blurry vision, no xerophthalmia ENT: no sore throat, no nodules palpated in throat, nodysphagia/no odynophagia, no hoarseness Cardiovascular: no CP/SOB/palpitations/leg swelling Respiratory: no cough/SOB Gastrointestinal: no N/V/D/C Musculoskeletal: no muscle/joint aches Skin: no rashes, + hair  falling Neurological: no tremors/numbness/tingling/dizziness  PE: BP 118/68  Pulse 76  Temp(Src) 97.7 F (36.5 C) (Oral)  Resp 12  Wt 217 lb 11.2 oz (98.748 kg)  SpO2 96% Wt Readings from Last 3 Encounters:  03/13/13 217 lb 11.2 oz (98.748 kg)  11/15/12 200 lb (90.719 kg)  08/05/12 187 lb (84.823 kg)   Constitutional: obese, Body mass index is 33.57 kg/(m^2). in NAD Eyes: PERRLA, EOMI, no exophthalmos, no lid lag, no stare ENT: moist mucous membranes, + large thyroid, thyromegaly R>L, no individual nodules palpated, no cervical lymphadenopathy Cardiovascular: RRR, No MRG Respiratory: CTA B Gastrointestinal: abdomen soft, NT, ND, BS+ Musculoskeletal: no deformities, strength intact in all 4 Skin: moist, warm, no rashes  ASSESSMENT: 1. Graves ds.  PLAN:  1. Pt with Graves' disease, on methimazole - tapering it down, now at 5 mg daily.  - She complains of weight gain again and also did not have a menstrual cycle in last ~3 mo. She did not see ObGyn as advised by PCP, I discussed with her to have an appt with them - discussed diet - she appears to be mindful of what and how much she eats. If weight gain continues, will check for Cushing's sd., although no other associated sxs and signs - Will check TSH, free T4, free T3 today, I will decide how to change the methimazole dose based on the results, but I suspect we can stop MMI and recheck TSH and fT4 in 4-6 weeks - I will  see her back in 3 months  Orders Only on 03/13/2013  Component Date Value Range Status  . TSH 03/13/2013 3.484  0.350 - 4.500 uIU/mL Final  . Free T4 03/13/2013 0.76* 0.80 - 1.80 ng/dL Final  . T3, Free 16/12/9602 2.6  2.3 - 4.2 pg/mL Final   Stop MMI >> recheck tests in 2 mo.

## 2013-03-13 NOTE — Patient Instructions (Signed)
Please return in 3 months. We will likely need repeat thyroid labs in 4-5 weeks. Please stop at the lab "[facility]" today. Please see ObGyn about your lack of menstrual cycles.

## 2013-03-14 LAB — T4, FREE: Free T4: 0.76 ng/dL — ABNORMAL LOW (ref 0.80–1.80)

## 2013-03-14 LAB — T3, FREE: T3 FREE: 2.6 pg/mL (ref 2.3–4.2)

## 2013-03-14 LAB — TSH: TSH: 3.484 u[IU]/mL (ref 0.350–4.500)

## 2013-06-12 IMAGING — US US TRANSVAGINAL NON-OB
1 series · 14 of 25 positions shown · non-contrast
Comparison: None.

CLINICAL DATA: Oligomenorrhea



[Series 1: us transvaginal non-ob · 0.24mm/px · 14 of 73 slices shown]
[im 1/73]
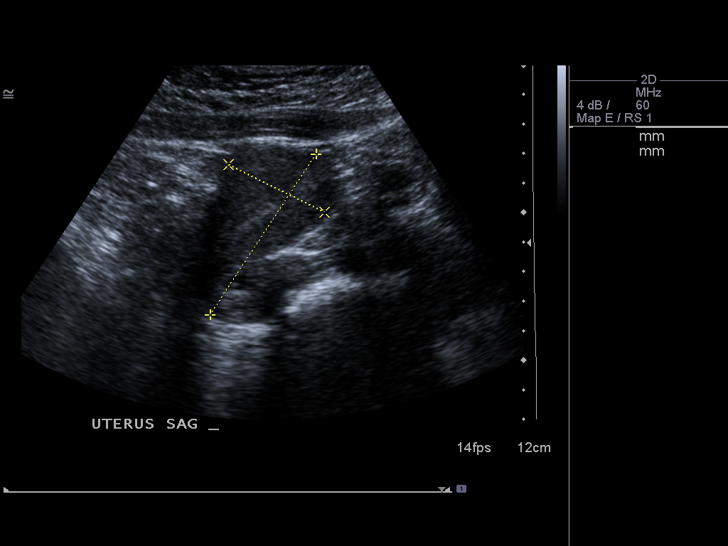
[im 7/73]
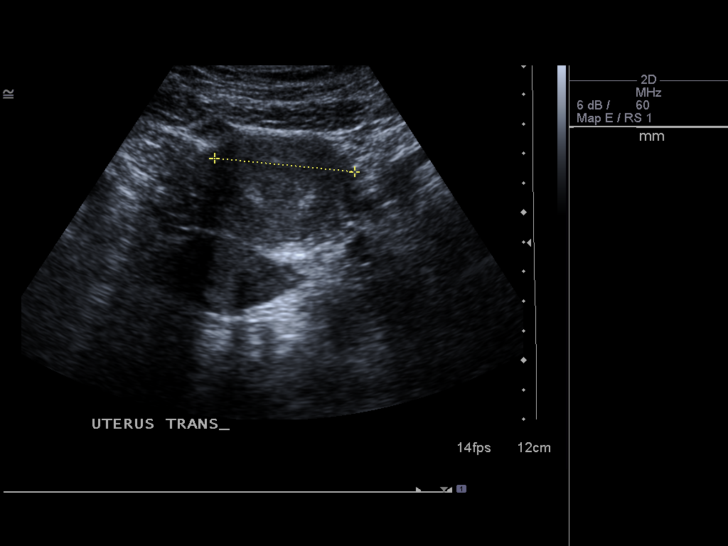
[im 13/73]
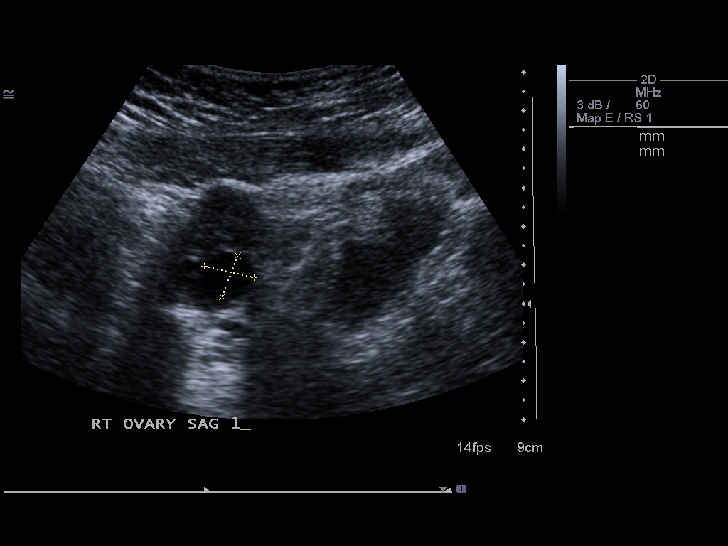
[im 19/73]
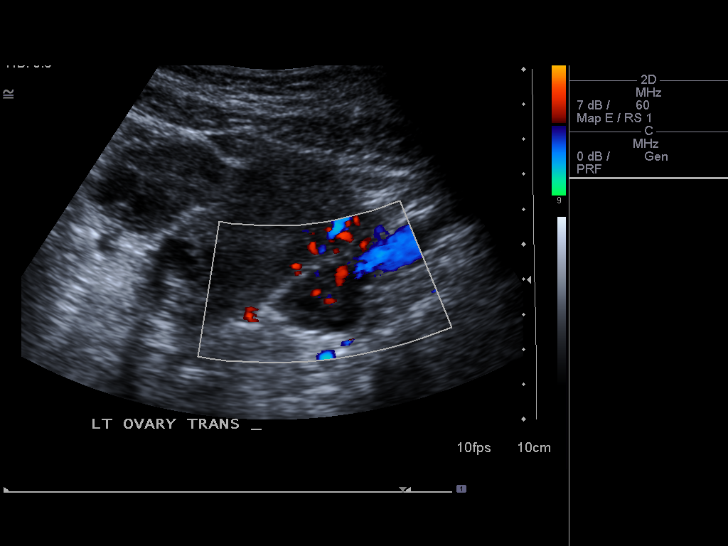
[im 25/73]
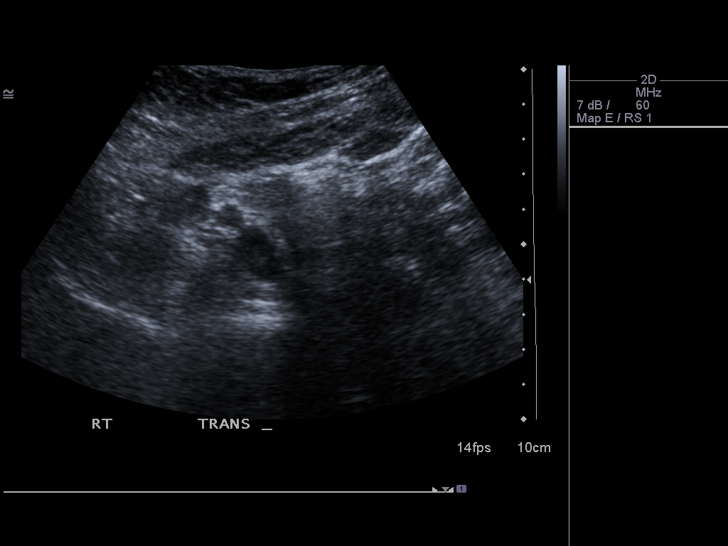
[im 28/73]
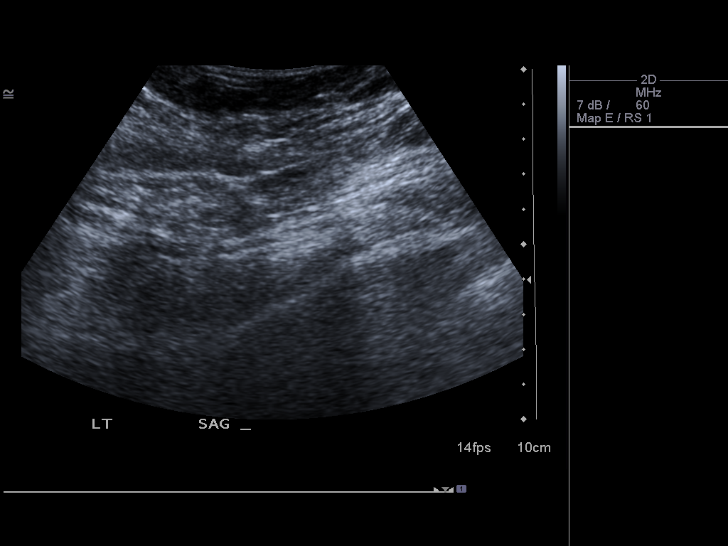
[im 34/73]
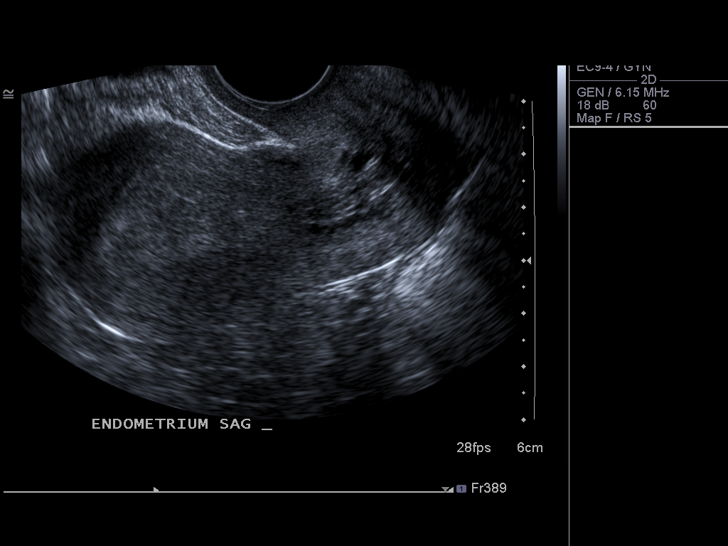
[im 40/73]
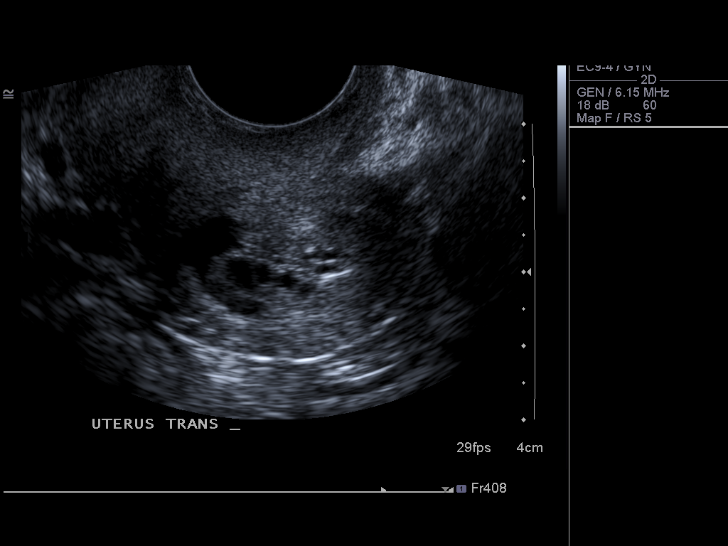
[im 46/73]
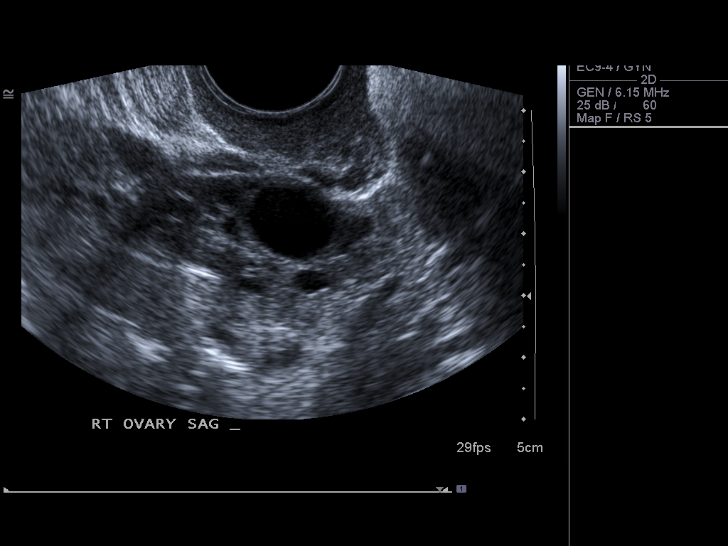
[im 49/73]
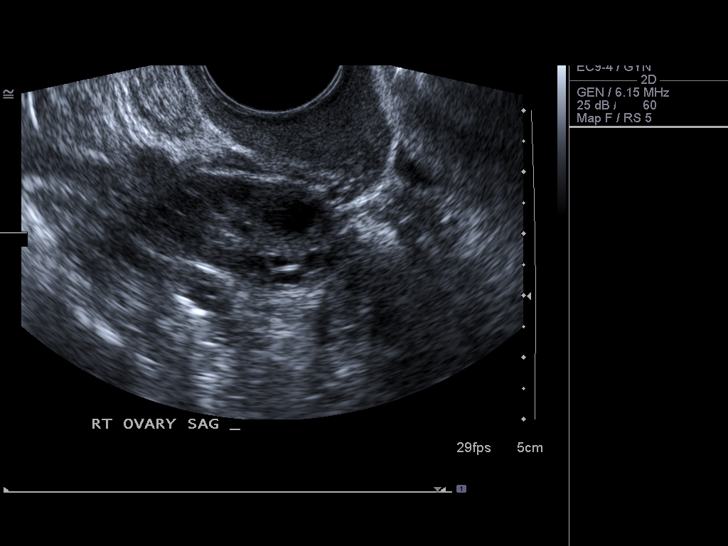
[im 55/73]
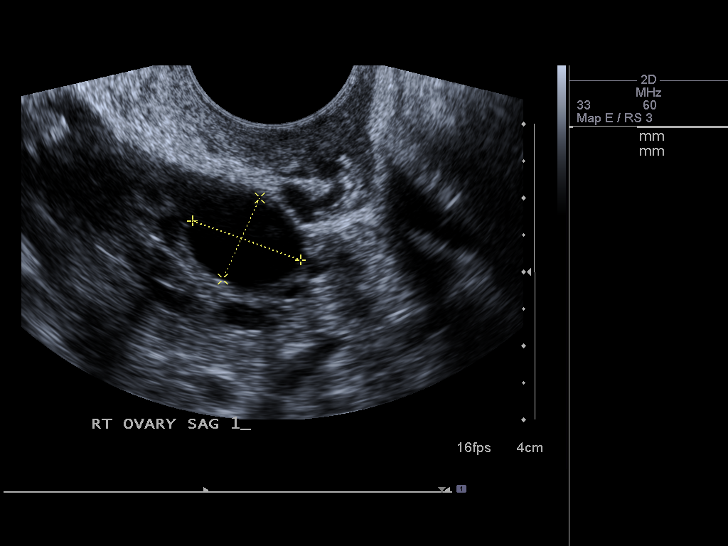
[im 61/73]
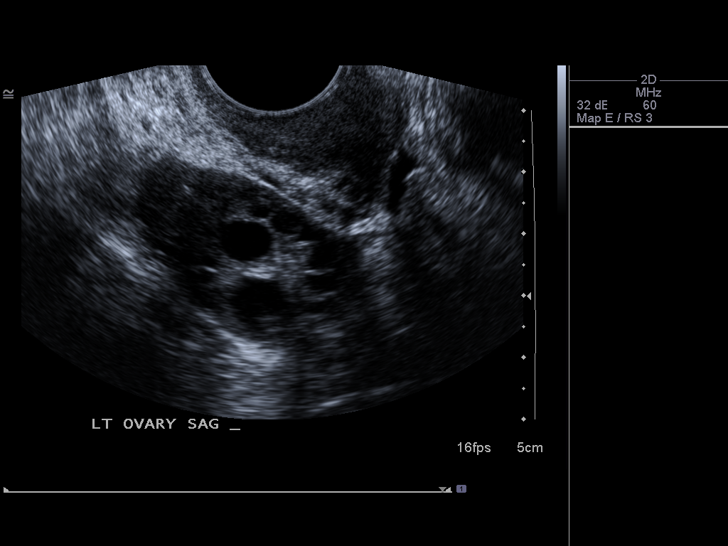
[im 67/73]
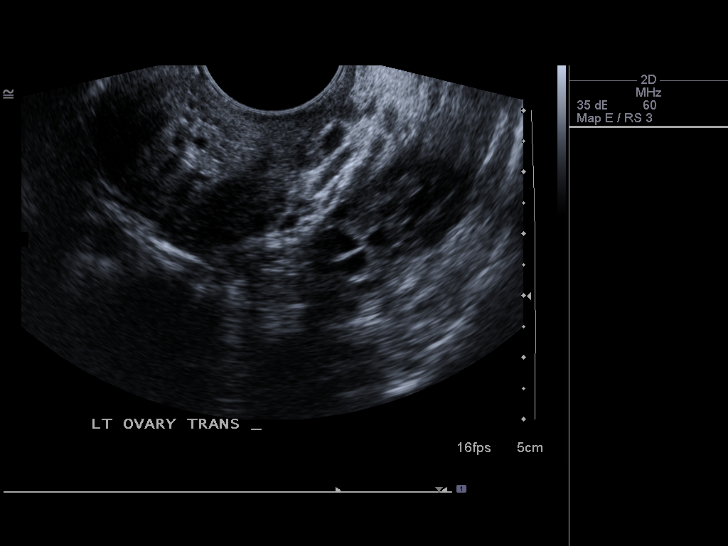
[im 73/73]
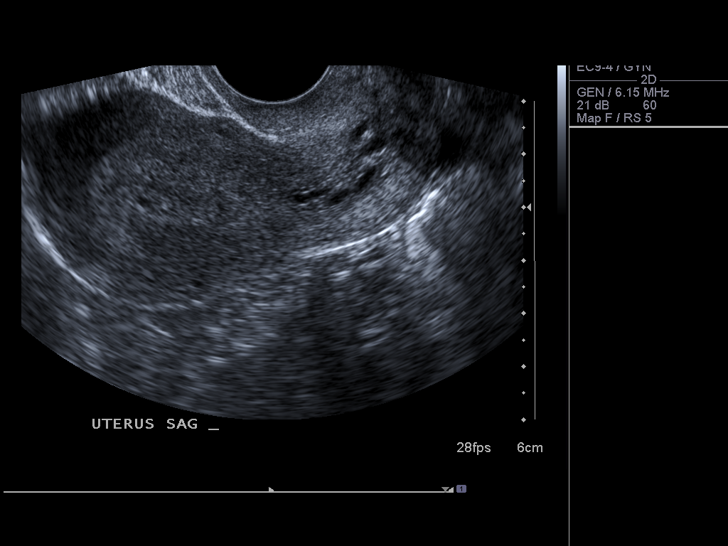

[14 of 25 positions shown; findings below may reference images not displayed]

FINDINGS: Uterus:  7.7 x 4.6 x 4.0 cm.  Normal.

Endometrium: 1.6 cm, inhomogeneous without focal measurable
abnormality.

Right ovary: 3.4 x 2.6 x 2.1 cm.  Normal.

Left ovary: 3.9 x 2.3 x 2.2 cm.  Normal.

Other Findings:  Trace free fluid.
IMPRESSION: Inhomogeneous endometrium which could indicate normal cyclical
variation, underlying polyp, hyperplasia or neoplasia, less likely
submucosal fibroid.  Consider re-imaging during week immediately
following the patient's next menses or withdrawal bleeding, for
better evaluation.

## 2013-06-20 ENCOUNTER — Ambulatory Visit: Payer: BC Managed Care – PPO | Admitting: Internal Medicine

## 2013-07-29 ENCOUNTER — Encounter: Payer: Self-pay | Admitting: Internal Medicine

## 2013-07-29 ENCOUNTER — Ambulatory Visit (INDEPENDENT_AMBULATORY_CARE_PROVIDER_SITE_OTHER): Payer: BC Managed Care – PPO | Admitting: Internal Medicine

## 2013-07-29 VITALS — BP 112/62 | HR 73 | Temp 98.4°F | Resp 12 | Wt 225.8 lb

## 2013-07-29 DIAGNOSIS — E0500 Thyrotoxicosis with diffuse goiter without thyrotoxic crisis or storm: Secondary | ICD-10-CM

## 2013-07-29 LAB — T4, FREE: FREE T4: 1.24 ng/dL (ref 0.60–1.60)

## 2013-07-29 LAB — TSH: TSH: 0.05 u[IU]/mL — AB (ref 0.35–4.50)

## 2013-07-29 LAB — T3, FREE: T3, Free: 3.1 pg/mL (ref 2.3–4.2)

## 2013-07-29 NOTE — Patient Instructions (Signed)
Please return in 6 months for a visit. I will let you know if we need to check labs until then.

## 2013-07-29 NOTE — Progress Notes (Signed)
Patient ID: Natasha Scott, female   DOB: 1985-12-31, 28 y.o.   MRN: [id number]   HPI  Natasha Scott is a 28 y.o.-year-old female, returning for management of Graves' disease.  Last visit 4 mo ago.  Reviewed hx: I saw the patient in 08/2012 for evaluation for thyrotoxicosis. We checked a thyroid uptake and scan on 08/27/2012, and it showed an uptake of 77%, with a homogeneous thyroid scan, consistent with Graves' disease. At that time, I discussed with the patient about the possibility of treating her with radioactive iodine or starting on methimazole. She decided to go with the methimazole, which we started at 10 mg bid.   I reviewed her latest TSH levels: Lab Results  Component Value Date   TSH 3.484 03/13/2013   TSH 4.03 01/29/2013   TSH 0.17* 11/15/2012   TSH 0.11* 07/16/2012   TSH 0.10* 06/28/2012   TSH 0.43 01/11/2009   FREET4 0.76* 03/13/2013   FREET4 0.46* 01/29/2013   FREET4 3.36* 11/15/2012   FREET4 5.13* 07/16/2012   We were able to decrease the MMI from 10 mg bid to 5 mg bid and then to 5 mg daily and then finally stopped it, after the following labs: Orders Only on 03/13/2013  Component Date Value Ref Range Status  . TSH 03/13/2013 3.484  0.350 - 4.500 uIU/mL Final  . Free T4 03/13/2013 0.76* 0.80 - 1.80 ng/dL Final  . T3, Free 02/77/4128 2.6  2.3 - 4.2 pg/mL Final   She did not return to have labs checked after 2 mo.  We stopped Atenolol also at a previous visit. No palpitations.   Pt c/o: - + weight loss/ gain  - + sleeping more - no heat intolerance - no tremors - no anxiety - no palpitations - no hyperdefecation/constipation - no dry skin - no hair loss She did not have a menstrual cycle in >2 mo. She was on Provera before but no cycles. She did not see an ObGyn dr yet.   She started to work out 3-4x a week and started to change her diet, so she started to lose weight from 250 lbs >> Today 225 lbs.  She denies dysphagia, odynophagia, lumps in neck or SOB with  lying down.   I reviewed pt's medications, allergies, PMH, social hx, family hx and no changes required, except as mentioned above.  ROS: Constitutional: + weight gain and loss, no fatigue, no subjective hyperthermia/hypothermia Eyes: no blurry vision, no xerophthalmia ENT: no sore throat, no nodules palpated in throat, nodysphagia/no odynophagia, no hoarseness Cardiovascular: no CP/SOB/palpitations/leg swelling Respiratory: no cough/SOB Gastrointestinal: no N/V/D/C Musculoskeletal: no muscle/joint aches Skin: no rashes, no hair falling Neurological: no tremors/numbness/tingling/dizziness  PE: BP 112/62  Pulse 73  Temp(Src) 98.4 F (36.9 C) (Oral)  Resp 12  Wt 225 lb 12.8 oz (102.422 kg)  SpO2 96% Body mass index is 34.82 kg/(m^2).  Wt Readings from Last 3 Encounters:  07/29/13 225 lb 12.8 oz (102.422 kg)  03/13/13 217 lb 11.2 oz (98.748 kg)  11/15/12 200 lb (90.719 kg)   Constitutional: obese,  in NAD Eyes: PERRLA, EOMI, no exophthalmos, no lid lag, no stare ENT: moist mucous membranes, + large thyroid, thyromegaly R>L, no individual nodules palpated, no cervical lymphadenopathy Cardiovascular: RRR, No MRG Respiratory: CTA B Gastrointestinal: abdomen soft, NT, ND, BS+ Musculoskeletal: no deformities, strength intact in all 4 Skin: moist, warm, no rashes  ASSESSMENT: 1. Graves ds.  2. Obesity  PLAN:  1. Pt with Graves' disease, now off methimazole, asymptomatic -  she has no hyperthyroid sxs - Will check TSH, free T4, free T3 today - she may need to return for labs before next visit - advised her to let me know f she develops hyperthyroid sxs - continue to stay off MMI for now - I will see her back in 6 months  2. Obesity - She gained a large amount of weight and at last visit we discussed about the importance of diet and exercise >> she started now to lose weight (25 lbs) after she started to go to the gym and is mindful of the foods and portions she eats -  menses also returned after she lost weight  Office Visit on 07/29/2013  Component Date Value Ref Range Status  . T3, Free 07/29/2013 3.1  2.3 - 4.2 pg/mL Final  . TSH 07/29/2013 0.05* 0.35 - 4.50 uIU/mL Final  . Free T4 07/29/2013 1.24  0.60 - 1.60 ng/dL Final   Unfortunately, we need to restart the Methimazole at 5 mg daily in am. Will repeat Thyroid labs in 2 months (ordered).

## 2013-07-31 ENCOUNTER — Encounter: Payer: Self-pay | Admitting: Internal Medicine

## 2013-08-11 ENCOUNTER — Other Ambulatory Visit: Payer: Self-pay | Admitting: Internal Medicine

## 2013-08-11 MED ORDER — METHIMAZOLE 5 MG PO TABS: 5.0000 mg | ORAL_TABLET | Freq: Every day | ORAL | Status: AC

## 2014-01-22 ENCOUNTER — Encounter: Payer: Self-pay | Admitting: Internal Medicine

## 2014-01-23 ENCOUNTER — Ambulatory Visit: Payer: BC Managed Care – PPO | Admitting: Internal Medicine

## 2014-03-13 ENCOUNTER — Ambulatory Visit: Payer: Self-pay | Admitting: Internal Medicine

## 2014-08-11 ENCOUNTER — Ambulatory Visit: Payer: Self-pay | Admitting: Internal Medicine

## 2014-08-11 DIAGNOSIS — Z0289 Encounter for other administrative examinations: Secondary | ICD-10-CM

## 2015-01-19 ENCOUNTER — Encounter: Payer: Self-pay | Admitting: Family Medicine

## 2015-01-19 ENCOUNTER — Encounter: Payer: Self-pay | Admitting: Internal Medicine
